# Patient Record
Sex: Male | Born: 1970 | Race: Black or African American | Hispanic: No | Marital: Married | Smoking: Current every day smoker
Health system: Southern US, Community
[De-identification: ages and names within clinical notes are randomized; demographics above are authoritative.]

---

## 2007-05-05 ENCOUNTER — Ambulatory Visit: Payer: Self-pay | Admitting: Emergency Medicine

## 2009-01-05 ENCOUNTER — Emergency Department: Payer: Self-pay | Admitting: Emergency Medicine

## 2014-04-12 ENCOUNTER — Ambulatory Visit: Payer: Self-pay | Admitting: Physician Assistant

## 2015-02-03 ENCOUNTER — Encounter: Payer: Self-pay | Admitting: Emergency Medicine

## 2015-02-03 ENCOUNTER — Emergency Department: Payer: BLUE CROSS/BLUE SHIELD

## 2015-02-03 ENCOUNTER — Emergency Department
Admission: EM | Admit: 2015-02-03 | Discharge: 2015-02-03 | Disposition: A | Discharge status: Home / Self Care | Payer: BLUE CROSS/BLUE SHIELD | Attending: Emergency Medicine | Admitting: Emergency Medicine

## 2015-02-03 DIAGNOSIS — M7651 Patellar tendinitis, right knee: Secondary | ICD-10-CM | POA: Insufficient documentation

## 2015-02-03 DIAGNOSIS — M25561 Pain in right knee: Secondary | ICD-10-CM | POA: Diagnosis present

## 2015-02-03 DIAGNOSIS — M549 Dorsalgia, unspecified: Secondary | ICD-10-CM | POA: Diagnosis not present

## 2015-02-03 DIAGNOSIS — Z720 Tobacco use: Secondary | ICD-10-CM | POA: Insufficient documentation

## 2015-02-03 MED ORDER — TRAMADOL HCL 50 MG PO TABS: 50.0000 mg | ORAL_TABLET | Freq: Four times a day (QID) | ORAL | Status: AC | PRN

## 2015-02-03 MED ORDER — IBUPROFEN 800 MG PO TABS
ORAL_TABLET | ORAL | Status: AC
  Administered 2015-02-03: 800 mg via ORAL
  Filled 2015-02-03: qty 1

## 2015-02-03 MED ORDER — IBUPROFEN 800 MG PO TABS
800.0000 mg | ORAL_TABLET | Freq: Once | ORAL | Status: AC
  Administered 2015-02-03: 800 mg via ORAL

## 2015-02-03 MED ORDER — TRAMADOL HCL 50 MG PO TABS
ORAL_TABLET | ORAL | Status: AC
  Administered 2015-02-03: 50 mg via ORAL
  Filled 2015-02-03: qty 1

## 2015-02-03 MED ORDER — TRAMADOL HCL 50 MG PO TABS
50.0000 mg | ORAL_TABLET | Freq: Once | ORAL | Status: AC
  Administered 2015-02-03: 50 mg via ORAL

## 2015-02-03 MED ORDER — NAPROXEN 500 MG PO TABS: 500.0000 mg | ORAL_TABLET | Freq: Two times a day (BID) | ORAL | Status: AC

## 2015-02-03 NOTE — ED Notes (Signed)
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

## 2015-02-03 NOTE — ED Provider Notes (Signed)
[facility] Emergency Department Provider Note  ____________________________________________  Time seen: Approximately 7:47 PM  I have reviewed the triage vital signs and the nursing notes.   HISTORY  Chief Complaint Knee Pain    HPI George Jarvis is a 44 y.o. male patient complaining of onset of right knee pain secondary to increased physical activities at work. Patient states the pain is that they inferior part of his and radiates down the lateral aspect of his leg. Patient states pain increases with squatting or prolonged standing. Patient's rate is pain is 7/10. He has any loss of sensation or loss of strength.   History reviewed. No pertinent past medical history.  There are no active problems to display for this patient.   History reviewed. No pertinent past surgical history.  Current Outpatient Rx  Name  Route  Sig  Dispense  Refill  . naproxen (NAPROSYN) 500 MG tablet   Oral   Take 1 tablet (500 mg total) by mouth 2 (two) times daily with a meal.   20 tablet   00   . traMADol (ULTRAM) 50 MG tablet   Oral   Take 1 tablet (50 mg total) by mouth every 6 (six) hours as needed for moderate pain.   12 tablet   0     Allergies Review of patient's allergies indicates no known allergies.  History reviewed. No pertinent family history.  Social History History  Substance Use Topics  . Smoking status: Current Every Day Smoker -- 1.00 packs/day    Types: Cigarettes  . Smokeless tobacco: Not on file  . Alcohol Use: Yes    Review of Systems Constitutional: No fever/chills Eyes: No visual changes. ENT: No sore throat. Cardiovascular: Denies chest pain. Respiratory: Denies shortness of breath. Gastrointestinal: No abdominal pain.  No nausea, no vomiting.  No diarrhea.  No constipation. Genitourinary: Negative for dysuria. Musculoskeletal: Positive for back pain. Pain right inferior knee. Skin: Negative for rash. Neurological:  Negative for headaches, focal weakness or numbness.  10-point ROS otherwise negative.  ____________________________________________   PHYSICAL EXAM:  VITAL SIGNS: ED Triage Vitals  Enc Vitals Group     BP --      Pulse --      Resp --      Temp --      Temp src --      SpO2 --      Weight --      Height --      Head Cir --      Peak Flow --      Pain Score --      Pain Loc --      Pain Edu? --      Excl. in GC? --     Constitutional: Alert and oriented. Well appearing and in moderate distress. Eyes: Conjunctivae are normal. PERRL. EOMI. Head: Atraumatic. Nose: No congestion/rhinnorhea. Mouth/Throat: Mucous membranes are moist.  Oropharynx non-erythematous. Neck: No stridor.  {Neck is supple Hematological/Lymphatic/Immunilogical: No cervical lymphadenopathy. Cardiovascular: Normal rate, regular rhythm. Grossly normal heart sounds.  Good peripheral circulation. Respiratory: Normal respiratory effort.  No retractions. Lungs CTAB. Gastrointestinal: Soft and nontender. No distention. No abdominal bruits. No CVA tenderness.  Musculoskeletal: Mild edema and erythema to the inferior patella. No joint effusions. Cardiac palpation. Patella tendon insertion. Limited weightbearing secondary to complain of pain when the is flexed. Neurologic:  Normal speech and language. No gross focal neurologic deficits are appreciated. Speech is normal. No gait instability. Skin:  Skin  is warm, dry and intact. No rash noted. Mild edema and inferior patella. Psychiatric: Mood and affect are normal. Speech and behavior are normal.  ____________________________________________   LABS (all labs ordered are listed, but only abnormal results are displayed)  Labs Reviewed - No data to display ____________________________________________  EKG  _____________________________________  RADIOLOGY   _________No acute findings___________________________________   PROCEDURES  Procedure(s)  performed: None  Critical Care performed: No  ____________________________________________   INITIAL IMPRESSION / ASSESSMENT AND PLAN / ED COURSE  Pertinent labs & imaging results that were available during my care of the patient were reviewed by me and considered in my medical decision making (see chart for details).  Inferior patella tendinitis ____________________________________________   FINAL CLINICAL IMPRESSION(S) / ED DIAGNOSES  Final diagnoses:  Patellar tendinitis, right      Joni ReiningRonald K Zo Loudon, PA-C 02/03/15 2053  Sharyn CreamerMark Quale, MD 02/15/15 [phone]

## 2015-02-03 NOTE — Discharge Instructions (Signed)
Wear splint for 5-7 days as needed. Take medications as directed.

## 2016-10-22 IMAGING — CR DG KNEE COMPLETE 4+V*R*
1 series · 4 of 4 positions shown · non-contrast
Comparison: None.

CLINICAL DATA: 43-year-old male with pain, tenderness and swelling
inferior to the right patella. No known injury. Patients works and
stands long hours.

EXAM:
RIGHT KNEE - COMPLETE 4+ VIEW

[Series 1: t knee ap right · 0.14mm/px · 4 of 4 slices shown]
[im 1/4]
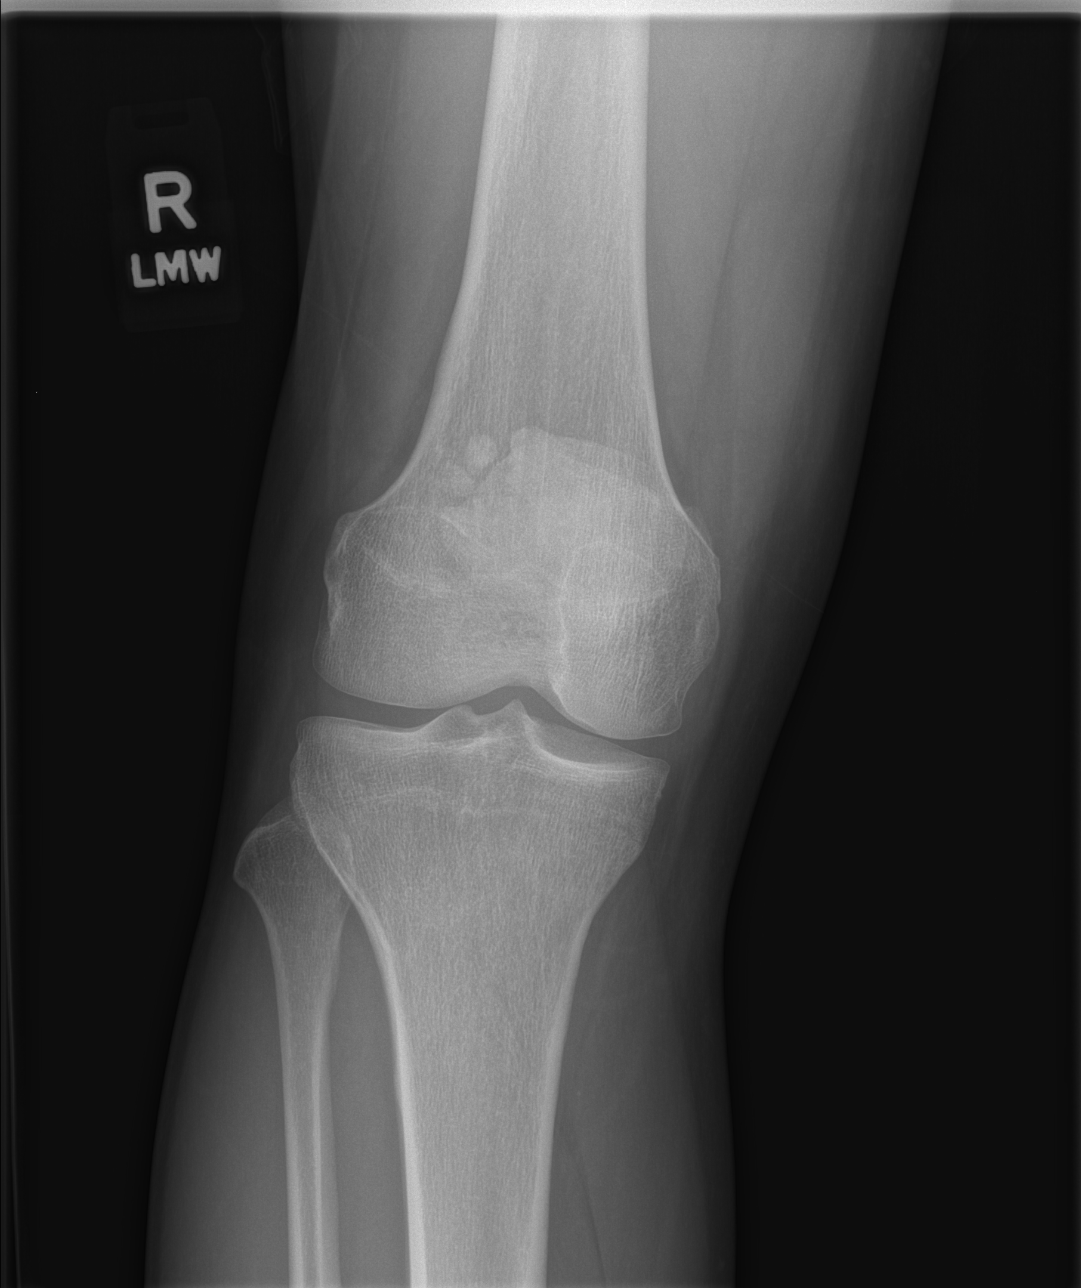
[im 2/4]
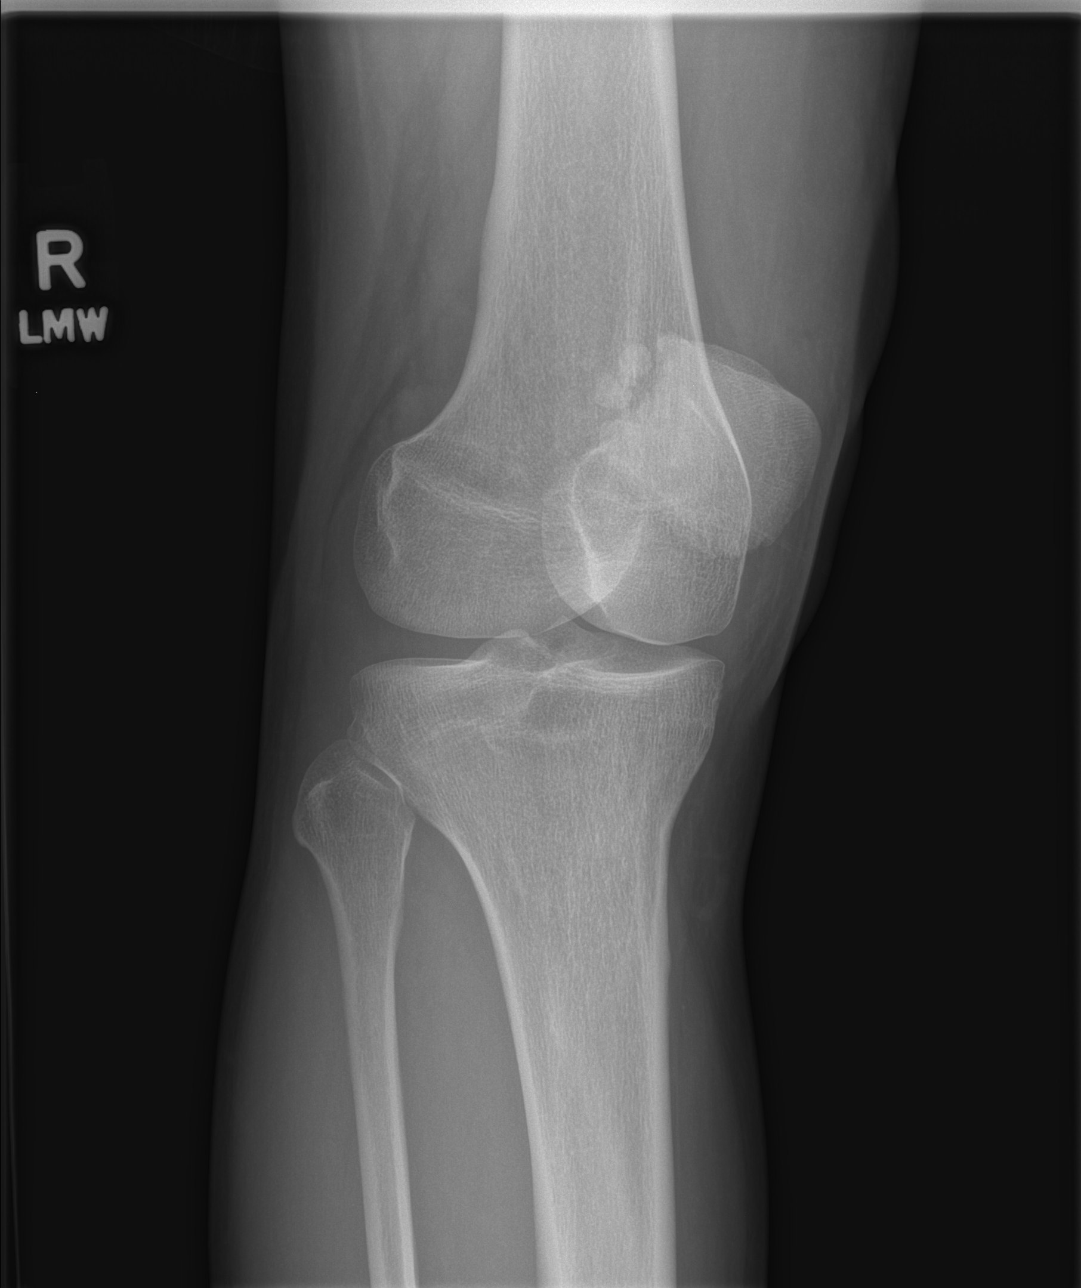
[im 3/4]
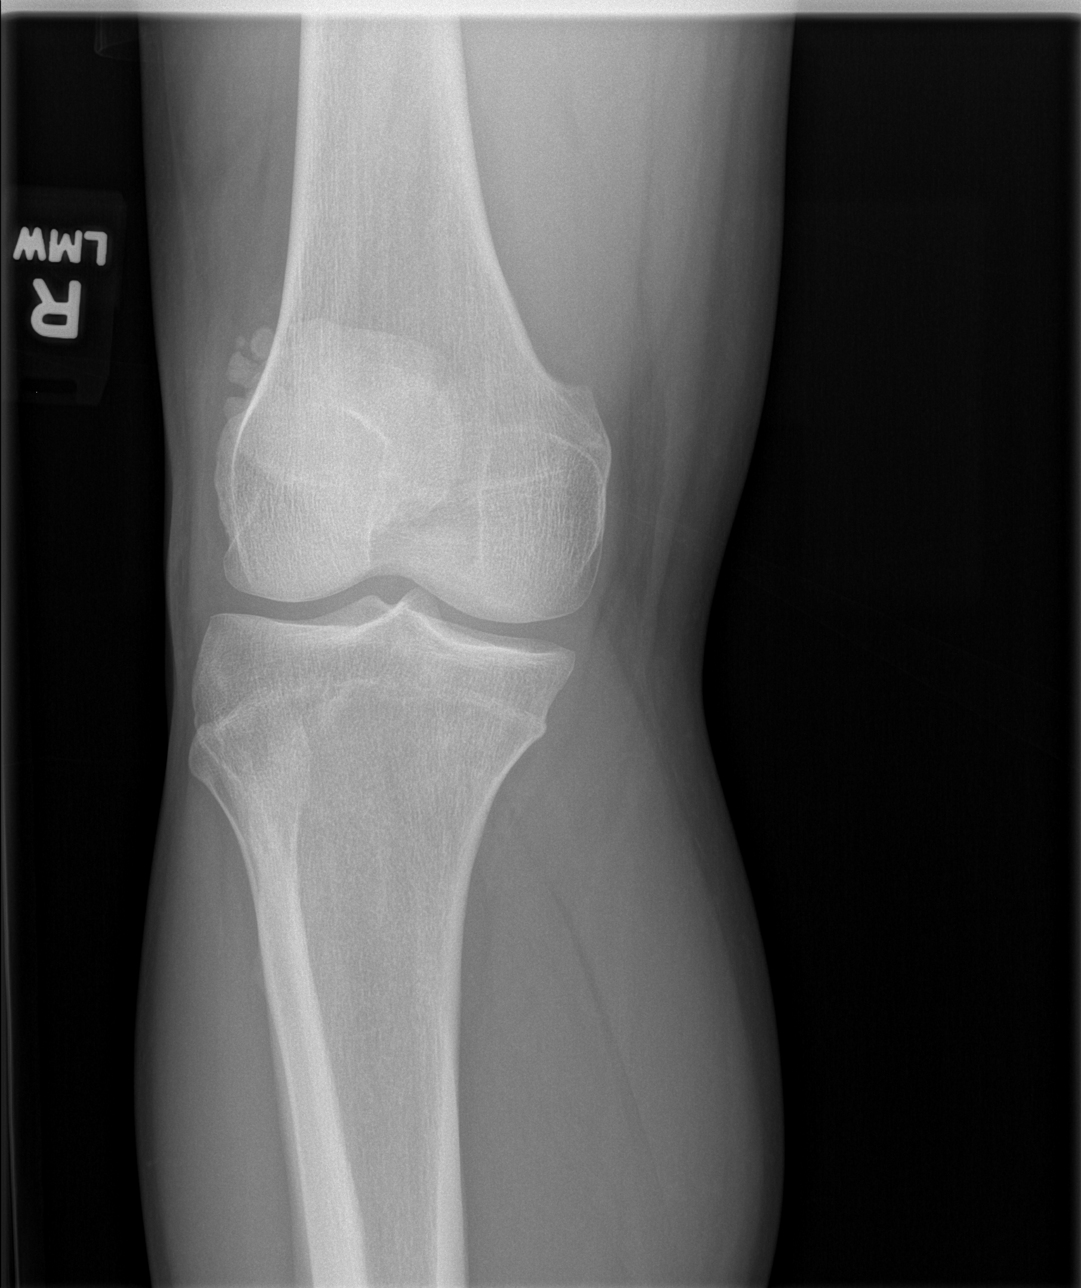
[im 4/4]
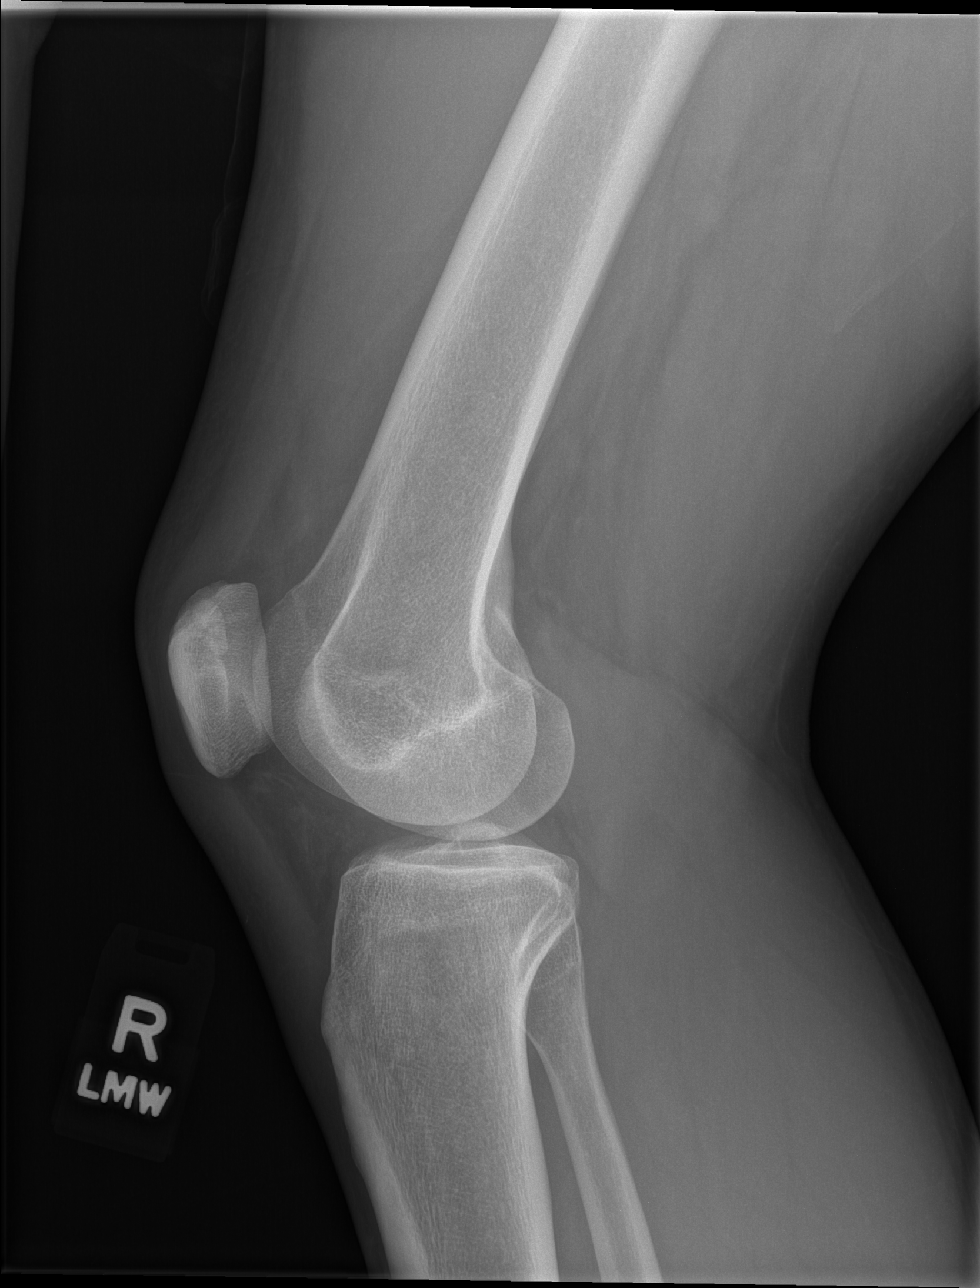

[4 of 4 positions shown; findings below may reference images not displayed]

FINDINGS: There is no evidence of fracture, dislocation, or joint effusion.
Bipartite patella noted incidentally. Normal bony mineralization. No
lytic or blastic osseous lesion. Soft tissues are unremarkable.
IMPRESSION: Negative.

Incidental note is made of a bipartite patella.
